# Patient Record
Sex: Male | Born: 1955 | Race: White | Hispanic: No | Marital: Married | Smoking: Former smoker
Health system: Southern US, Community
[De-identification: ages and names within clinical notes are randomized; demographics above are authoritative.]

## PROBLEM LIST (undated history)

## (undated) DIAGNOSIS — M1990 Unspecified osteoarthritis, unspecified site: Secondary | ICD-10-CM

## (undated) DIAGNOSIS — G4733 Obstructive sleep apnea (adult) (pediatric): Secondary | ICD-10-CM

## (undated) DIAGNOSIS — I10 Essential (primary) hypertension: Secondary | ICD-10-CM

## (undated) DIAGNOSIS — I2699 Other pulmonary embolism without acute cor pulmonale: Secondary | ICD-10-CM

## (undated) DIAGNOSIS — E669 Obesity, unspecified: Secondary | ICD-10-CM

## (undated) HISTORY — DX: Unspecified osteoarthritis, unspecified site: M19.90

## (undated) HISTORY — DX: Obstructive sleep apnea (adult) (pediatric): G47.33

## (undated) HISTORY — DX: Other pulmonary embolism without acute cor pulmonale: I26.99

## (undated) HISTORY — PX: TOTAL HIP ARTHROPLASTY: SHX124

## (undated) HISTORY — DX: Essential (primary) hypertension: I10

## (undated) HISTORY — DX: Obesity, unspecified: E66.9

---

## 2002-03-07 ENCOUNTER — Encounter: Payer: Self-pay | Admitting: Orthopedic Surgery

## 2002-03-10 ENCOUNTER — Inpatient Hospital Stay (HOSPITAL_COMMUNITY): Admission: RE | Admit: 2002-03-10 | Discharge: 2002-03-14 | Payer: Self-pay | Admitting: Orthopedic Surgery

## 2002-03-10 ENCOUNTER — Encounter: Payer: Self-pay | Admitting: Orthopedic Surgery

## 2003-02-15 ENCOUNTER — Encounter: Payer: Self-pay | Admitting: Orthopedic Surgery

## 2003-02-15 ENCOUNTER — Encounter: Payer: Self-pay | Admitting: Internal Medicine

## 2003-02-15 ENCOUNTER — Inpatient Hospital Stay (HOSPITAL_COMMUNITY): Admission: RE | Admit: 2003-02-15 | Discharge: 2003-02-23 | Payer: Self-pay | Admitting: Orthopedic Surgery

## 2003-02-16 ENCOUNTER — Encounter: Payer: Self-pay | Admitting: Orthopedic Surgery

## 2003-02-19 ENCOUNTER — Encounter: Payer: Self-pay | Admitting: Orthopedic Surgery

## 2003-02-20 ENCOUNTER — Encounter: Payer: Self-pay | Admitting: Orthopedic Surgery

## 2003-02-27 ENCOUNTER — Emergency Department (HOSPITAL_COMMUNITY): Admission: EM | Admit: 2003-02-27 | Discharge: 2003-02-27 | Payer: Self-pay | Admitting: Emergency Medicine

## 2004-02-13 ENCOUNTER — Inpatient Hospital Stay (HOSPITAL_COMMUNITY): Admission: RE | Admit: 2004-02-13 | Discharge: 2004-02-26 | Payer: Self-pay | Admitting: Orthopedic Surgery

## 2005-06-16 ENCOUNTER — Ambulatory Visit (HOSPITAL_COMMUNITY): Admission: RE | Admit: 2005-06-16 | Discharge: 2005-06-16 | Payer: Self-pay | Admitting: Orthopedic Surgery

## 2011-09-30 ENCOUNTER — Encounter: Payer: Self-pay | Admitting: Pulmonary Disease

## 2011-10-01 ENCOUNTER — Encounter: Payer: Self-pay | Admitting: Pulmonary Disease

## 2011-10-01 ENCOUNTER — Telehealth: Payer: Self-pay | Admitting: Pulmonary Disease

## 2011-10-01 ENCOUNTER — Ambulatory Visit (INDEPENDENT_AMBULATORY_CARE_PROVIDER_SITE_OTHER): Payer: Medicare Other | Admitting: Pulmonary Disease

## 2011-10-01 VITALS — BP 130/78 | HR 86 | Temp 98.0°F | Ht 69.0 in | Wt 277.0 lb

## 2011-10-01 DIAGNOSIS — R0600 Dyspnea, unspecified: Secondary | ICD-10-CM

## 2011-10-01 DIAGNOSIS — R0609 Other forms of dyspnea: Secondary | ICD-10-CM

## 2011-10-01 DIAGNOSIS — R0989 Other specified symptoms and signs involving the circulatory and respiratory systems: Secondary | ICD-10-CM

## 2011-10-01 NOTE — Patient Instructions (Signed)
Continue your current breathing medications Will set up for lung scan to look for small blood clots in lungs Will schedule for breathing tests, and see you back same day to review. If we do not find anything from lung standpoint to explain your worsening shortness of breath, I would recommend to your primary md to do cardiac evaluation with stress test and sound wave test of heart.  Discuss your blood pressure medication with primary md with regards to hoarseness.

## 2011-10-01 NOTE — Assessment & Plan Note (Signed)
The patient has significant worsening dyspnea on exertion over the last 6 months superimposed over his chronic dyspnea.  He apparently has a diagnosis of COPD, but those records are not available.  He is on a good bronchodilator regimen that he thinks has helped, but has had progressive symptoms despite this.  The patient is morbidly obese, and notes a 30 pound weight gain over the last 6 months.  I am sure that weight and deconditioning are playing roles here as well.  He does have some complicating factors, including his history of rheumatoid arthritis, being started on methotrexate about the same time that his breathing worsened, and a history of pulmonary embolus.  It raises the question whether he may have rheumatoid lung, possibly methotrexate pulmonary toxicity, or chronic thromboembolic disease.  I would like to repeat a chest x-ray, and also pulmonary function studies.  I think he also needs a ventilation perfusion scan to rule out chronic thromboembolic disease.  If all of this is unremarkable, I would highly recommend a cardiac workup by his primary care physician.

## 2011-10-01 NOTE — Progress Notes (Signed)
  Subjective:    Patient ID: Robert Andrade, male    DOB: 1956-04-27, 56 y.o.   MRN: [id number]  HPI The patient is a 56 year old male who I've been asked to see for significant dyspnea on exertion.  The patient states that he has had long-standing shortness of breath, but feels that it has been getting worse over the last 6-8 months.  He describes a one block dyspnea on exertion at a moderate pace on flat ground, and will get winded bringing groceries in from the car.  Records are not available, but he was apparently diagnosed with COPD in 2009.  He has been on Spiriva and symbicort and thinks the inhalers have helped him.  Complicating all of this, is his history of a pulmonary embolus in 2009 after having surgery.  He also has a history of rheumatoid arthritis, and started on methotrexate in September of last year.  He is also on low dose prednisone for this.  He does not have a chronic cough or mucus production, but has had some intermittent pleuritic chest pain.  He denies significant lower extremity edema except on occasion.  The patient states that his weight has increased 30 pounds over the last 6 months.  He also has intermittent hoarseness, but is on an ACE inhibitor.  He has not had pulmonary function studies or a chest x-ray since 2009.   Review of Systems  Constitutional: Positive for unexpected weight change. Negative for fever.  HENT: Positive for dental problem. Negative for ear pain, nosebleeds, congestion, sore throat, rhinorrhea, sneezing, trouble swallowing, postnasal drip and sinus pressure.   Eyes: Negative for redness and itching.  Respiratory: Positive for cough and shortness of breath. Negative for chest tightness and wheezing.   Cardiovascular: Positive for chest pain and leg swelling. Negative for palpitations.  Gastrointestinal: Negative for nausea and vomiting.  Genitourinary: Negative for dysuria.  Musculoskeletal: Positive for joint swelling.  Skin: Negative for rash.    Neurological: Negative for headaches.  Hematological: Does not bruise/bleed easily.  Psychiatric/Behavioral: Positive for dysphoric mood. The patient is not nervous/anxious.        Objective:   Physical Exam Constitutional:  Obese male, no acute distress  HENT:  Nares patent without discharge, turbinate hypertrophy  Oropharynx without exudate, palate and uvula are thick and elongated.  Eyes:  Perrla, eomi, no scleral icterus  Neck:  No JVD, no TMG  Cardiovascular:  Normal rate, regular rhythm, no rubs or gallops.  No murmurs        Intact distal pulses  Pulmonary :  Normal breath sounds, no stridor or respiratory distress   Faint basilar crackles, no wheezing  Abdominal:  Soft, nondistended, bowel sounds present.  No tenderness noted.   Musculoskeletal:  mild lower extremity edema noted.  Lymph Nodes:  No cervical lymphadenopathy noted  Skin:  No cyanosis noted  Neurologic:  Alert, appropriate, moves all 4 extremities without obvious deficit.         Assessment & Plan:

## 2011-10-01 NOTE — Telephone Encounter (Signed)
Megan, ;please make sure we follow thru with getting records from [facility] on this pt. Lawson Fiscal was supposed to initiate when you left for lunch. Need pfts, last ov note, any ct chest report or cxr report.

## 2011-10-03 ENCOUNTER — Telehealth: Payer: Self-pay | Admitting: Pulmonary Disease

## 2011-10-03 NOTE — Telephone Encounter (Signed)
Received copies from [facility],on 2.1.13. Forwarded 2 pages to Dr. Shelle Iron ,for review.  SJ

## 2011-10-08 ENCOUNTER — Telehealth: Payer: Self-pay | Admitting: Pulmonary Disease

## 2011-10-08 NOTE — Telephone Encounter (Signed)
Robert Andrade, please let pt know that we just received results of lung scan.  There were no blood clots seen.  Good news. Will see him back when he has pfts to discuss further.

## 2011-10-10 NOTE — Telephone Encounter (Signed)
LMOM for pt TCB 

## 2011-10-10 NOTE — Telephone Encounter (Signed)
Pt returned call. Robert Andrade  

## 2011-10-10 NOTE — Telephone Encounter (Signed)
Spoke with pt about his results per [facility]---no blood clots seen---good news---will follow up and discuss further with pt after pfts.   Pt voiced his understanding of this.

## 2011-10-16 NOTE — Telephone Encounter (Signed)
i have faxed [facility] MULTIPLE times with no response from Dr. Terrilee Croak office.  Called and LM with Nell informing her what records i need and to send asap.

## 2011-10-20 NOTE — Telephone Encounter (Signed)
FINALLY received records from Dr. Terrilee Croak office. Put in [facility] VIP folder for him to review.

## 2011-10-21 ENCOUNTER — Ambulatory Visit (INDEPENDENT_AMBULATORY_CARE_PROVIDER_SITE_OTHER): Payer: Medicare Other | Admitting: Pulmonary Disease

## 2011-10-21 ENCOUNTER — Encounter: Payer: Self-pay | Admitting: Pulmonary Disease

## 2011-10-21 VITALS — BP 120/70 | HR 96 | Temp 98.4°F | Ht 69.0 in | Wt 275.0 lb

## 2011-10-21 DIAGNOSIS — I2699 Other pulmonary embolism without acute cor pulmonale: Secondary | ICD-10-CM | POA: Insufficient documentation

## 2011-10-21 DIAGNOSIS — G4733 Obstructive sleep apnea (adult) (pediatric): Secondary | ICD-10-CM | POA: Insufficient documentation

## 2011-10-21 DIAGNOSIS — R0609 Other forms of dyspnea: Secondary | ICD-10-CM

## 2011-10-21 DIAGNOSIS — R0600 Dyspnea, unspecified: Secondary | ICD-10-CM

## 2011-10-21 LAB — PULMONARY FUNCTION TEST

## 2011-10-21 NOTE — Patient Instructions (Signed)
Your breathing tests are completely normal.  You do not have copd, but I cannot exclude the possibility of asthma.   Stop spiriva and symbicort.  If you have worsening of your breathing, then can get back on symbicort for treatment of asthma. Talk with Dr. Jeanie Sewer about coming off the lisinopril.  This can cause pulmonary symptoms and hoarseness which can mimic lung disease. Work on weight loss and conditioning I do not see a pulmonary cause of your shortness of breath, and you will need to discuss with Dr. Jeanie Sewer whether to take a look at your heart.

## 2011-10-21 NOTE — Assessment & Plan Note (Signed)
The patient's chest x-ray is unremarkable, there is no evidence for thromboembolic disease by his ventilation perfusion scan, and his PFTs today are normal.  I suspect that his dyspnea on exertion is coming from his morbid obesity and conditioning, but also cannot exclude a cardiac component.  I also think he needs to come off the ACE inhibitor because of his hoarseness and upper airway instability.  At this point, I have asked the patient to stop Spiriva and symbicort, but if he has worsening of his breathing, I would just start the symbicort back.  I have explained to him that normal breathing studies exclude COPD, but do not exclude possible asthma.

## 2011-10-21 NOTE — Progress Notes (Signed)
  Subjective:    Patient ID: Robert Andrade, male    DOB: 09-23-1955, 56 y.o.   MRN: [id number]  HPI Patient comes in today for followup of his pulmonary function studies, ordered as part of her workup for dyspnea on exertion.  He has had a chest x-ray that was unremarkable, as well as a ventilation perfusion scan showed no evidence for thromboembolic disease.  His PFTs today are surprisingly normal, with no evidence for significant COPD.  The patient continues to have significant hoarseness, however he is still on an ACE inhibitor.  I have reviewed the study with him in detail, and answered all of his questions.   Review of Systems  Constitutional: Positive for diaphoresis. Negative for fever and unexpected weight change.  HENT: Negative for ear pain, nosebleeds, congestion, sore throat, rhinorrhea, sneezing, trouble swallowing, dental problem, postnasal drip and sinus pressure.   Eyes: Negative for redness and itching.  Respiratory: Positive for chest tightness, shortness of breath and wheezing. Negative for cough.   Cardiovascular: Positive for leg swelling. Negative for palpitations.  Gastrointestinal: Negative for nausea and vomiting.  Genitourinary: Negative for dysuria.  Musculoskeletal: Positive for joint swelling.  Skin: Negative for rash.  Neurological: Negative for headaches.  Hematological: Does not bruise/bleed easily.  Psychiatric/Behavioral: Negative for dysphoric mood. The patient is not nervous/anxious.        Objective:   Physical Exam Morbidly obese male in no acute distress Nose without purulence or discharge noted Chest with mildly decreased breath sounds, no wheezing Lower extremities with mild edema, no cyanosis Alert and oriented, moves all 4 strands.       Assessment & Plan:

## 2011-10-21 NOTE — Progress Notes (Signed)
PFT done today. 

## 2014-01-02 ENCOUNTER — Institutional Professional Consult (permissible substitution): Payer: Medicare Other | Admitting: Internal Medicine

## 2014-01-04 ENCOUNTER — Encounter: Payer: Self-pay | Admitting: Pulmonary Disease

## 2014-01-04 ENCOUNTER — Ambulatory Visit (INDEPENDENT_AMBULATORY_CARE_PROVIDER_SITE_OTHER): Payer: Medicare Other | Admitting: Pulmonary Disease

## 2014-01-04 VITALS — BP 112/72 | HR 88 | Temp 98.6°F | Ht 69.0 in | Wt 288.0 lb

## 2014-01-04 DIAGNOSIS — R0989 Other specified symptoms and signs involving the circulatory and respiratory systems: Secondary | ICD-10-CM

## 2014-01-04 DIAGNOSIS — R0609 Other forms of dyspnea: Secondary | ICD-10-CM

## 2014-01-04 NOTE — Progress Notes (Signed)
   Subjective:    Patient ID: Robert Andrade, male    DOB: 08/19/1956, 58 y.o.   MRN: [id number]  HPI The patient comes in today for an acute sick visit. He has not been seen in over 2 years, and his prior evaluation for dyspnea on exertion showed no significant pulmonary issues. He was felt to possibly have asthma, but did not have COPD. I have recommended a cardiac workup, and the patient is unclear whether this was ever done or not. He comes in today with worsening dyspnea on exertion over the last 3 months, but is also gained 13 pounds since the last visit. He continues on Symbicort, and spirometry from his primary physician last month shows no significant airflow obstruction. He has had a chest x-ray which is not available for my review, but the report mentions increased interstitial markings. This was not an issue on his x-ray 2 years ago. The patient is on methotrexate and plaque 10 for arthritis, but he is unable to tell me whether this was rheumatoid or not. He denies any worsening lower extremity edema, nor classic pleuritic chest pain. He has had a history of a pulmonary embolus in the past. He also is describing chest pressure which is worsening with exertion.   Review of Systems  Constitutional: Negative for fever and unexpected weight change.  HENT: Negative for congestion, dental problem, ear pain, nosebleeds, postnasal drip, rhinorrhea, sinus pressure, sneezing, sore throat and trouble swallowing.   Eyes: Negative for redness and itching.  Respiratory: Positive for cough, chest tightness, shortness of breath and wheezing.   Cardiovascular: Positive for chest pain. Negative for palpitations and leg swelling.  Gastrointestinal: Negative for nausea and vomiting.  Genitourinary: Negative for dysuria.  Musculoskeletal: Negative for joint swelling.  Skin: Negative for rash.  Neurological: Negative for headaches.  Hematological: Does not bruise/bleed easily.  Psychiatric/Behavioral:  Negative for dysphoric mood. The patient is not nervous/anxious.        Objective:   Physical Exam Morbidly obese male in no acute distress Nose without purulence or discharge noted Neck without lymphadenopathy or thyromegaly Chest with minimal basilar crackles, excellent airflow, no wheezing Cardiac exam with regular rate and rhythm Lower extremities with no significant edema, no cyanosis Alert and oriented, moves all 4 extremities.       Assessment & Plan:

## 2014-01-04 NOTE — Patient Instructions (Signed)
Your asthma is NOT the cause of your shortness of breath.  Will schedule you for a lung scan to make sure you have not had another blood clot. Will do a ct scan of your chest to find out if the abnormalities on your chest xray are due to fluid or possible scarring from your arthritis.  Work on [employer] You need to let your primary md know that you are having chest pain, and may need a repeat cardiac evaluation. Will call you with results of your studies.

## 2014-01-04 NOTE — Assessment & Plan Note (Signed)
The patient has worsening dyspnea on exertion from his last visit, and especially over the last 3 months. His pulmonary workup in the past has been unremarkable, and his spirometry recently on medication shows no significant airflow obstruction. A recent chest x-ray however mentions on the report increased interstitial markings, and this was not an issue 2 years ago. The patient has a history of arthritis for which he is on immunosuppressive agents, but he is unsure if this is rheumatoid or not. I think we need to evaluate his abnormal x-ray with a high-resolution CT, to find out if his increased markings are do to edema or new interstitial lung disease. He also has a history of a pulmonary embolus, and I think we need to make sure this is not an issue now. Because of his elevated creatinine, I would like to avoid a CT angiogram, and we'll order a ventilation perfusion scan. I am also concerned about the potential for cardiac disease, and asked the patient to call his primary physician ASAP to let him know that he was having chest discomfort with activity. Finally, I stressed to the patient the importance of aggressive weight loss.

## 2014-01-09 ENCOUNTER — Encounter (HOSPITAL_COMMUNITY)
Admission: RE | Admit: 2014-01-09 | Discharge: 2014-01-09 | Disposition: A | Discharge status: Home / Self Care | Payer: Medicare Other | Source: Ambulatory Visit | Attending: Pulmonary Disease | Admitting: Pulmonary Disease

## 2014-01-09 ENCOUNTER — Encounter (HOSPITAL_COMMUNITY): Payer: Self-pay

## 2014-01-09 ENCOUNTER — Ambulatory Visit (HOSPITAL_COMMUNITY)
Admission: RE | Admit: 2014-01-09 | Discharge: 2014-01-09 | Disposition: A | Discharge status: Home / Self Care | Payer: Medicare Other | Source: Ambulatory Visit | Attending: Pulmonary Disease | Admitting: Pulmonary Disease

## 2014-01-09 DIAGNOSIS — I2510 Atherosclerotic heart disease of native coronary artery without angina pectoris: Secondary | ICD-10-CM | POA: Insufficient documentation

## 2014-01-09 DIAGNOSIS — I700 Atherosclerosis of aorta: Secondary | ICD-10-CM | POA: Insufficient documentation

## 2014-01-09 DIAGNOSIS — R0609 Other forms of dyspnea: Principal | ICD-10-CM

## 2014-01-09 DIAGNOSIS — J438 Other emphysema: Secondary | ICD-10-CM | POA: Insufficient documentation

## 2014-01-09 DIAGNOSIS — R0989 Other specified symptoms and signs involving the circulatory and respiratory systems: Principal | ICD-10-CM | POA: Insufficient documentation

## 2014-01-09 DIAGNOSIS — R0602 Shortness of breath: Secondary | ICD-10-CM | POA: Insufficient documentation

## 2014-01-09 MED ORDER — TECHNETIUM TO 99M ALBUMIN AGGREGATED
5.5000 | Freq: Once | INTRAVENOUS | Status: AC | PRN
  Administered 2014-01-09: 6 via INTRAVENOUS

## 2014-01-09 MED ORDER — TECHNETIUM TC 99M DIETHYLENETRIAME-PENTAACETIC ACID: 40.0000 | Freq: Once | INTRAVENOUS | Status: AC | PRN

## 2014-09-13 IMAGING — CR DG CHEST 2V
2 series · 2 of 2 positions shown · non-contrast
Comparison: 05/01/2013

CLINICAL DATA: Shortness of breath.

EXAM:
CHEST  2 VIEW

[w chest pa]
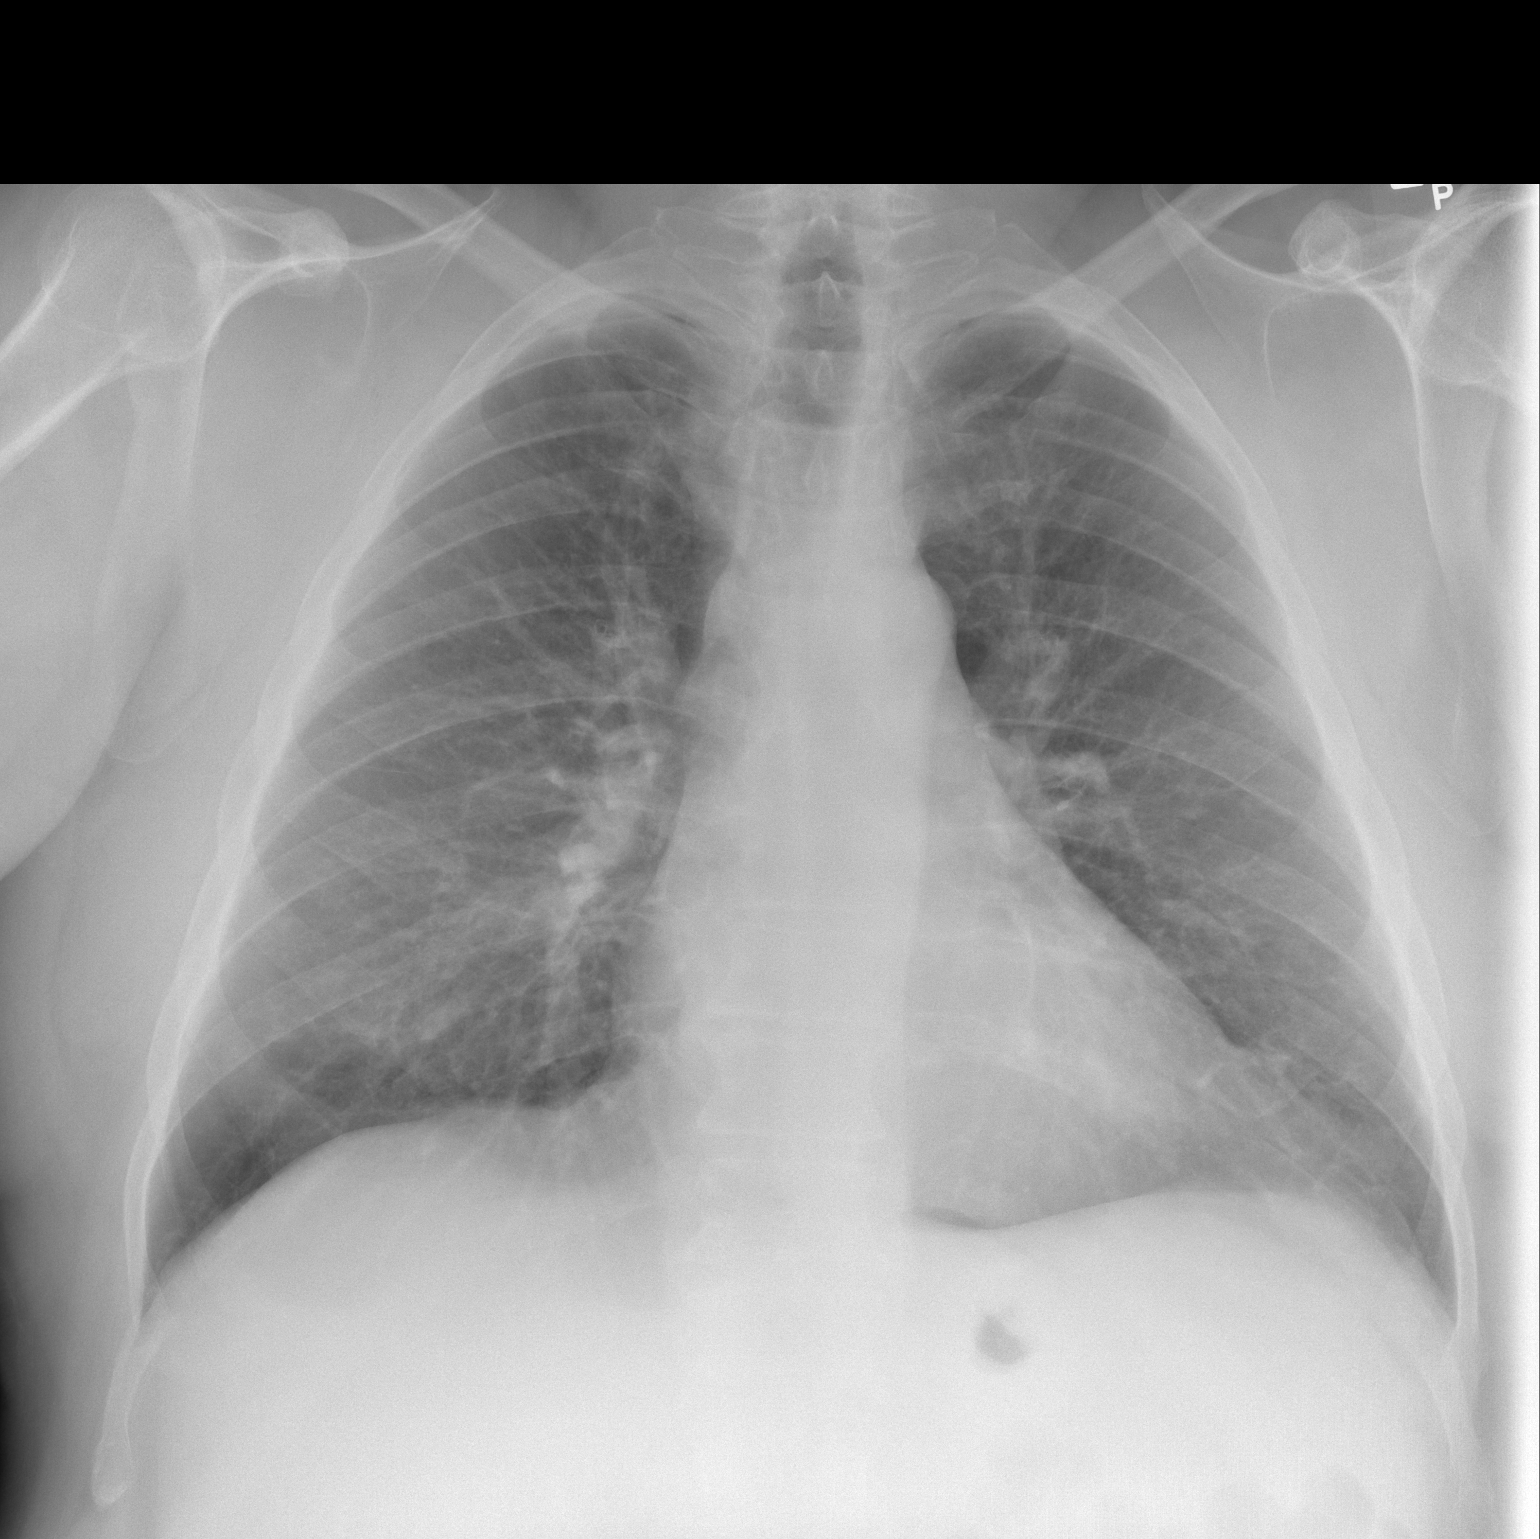

[w chest lat]
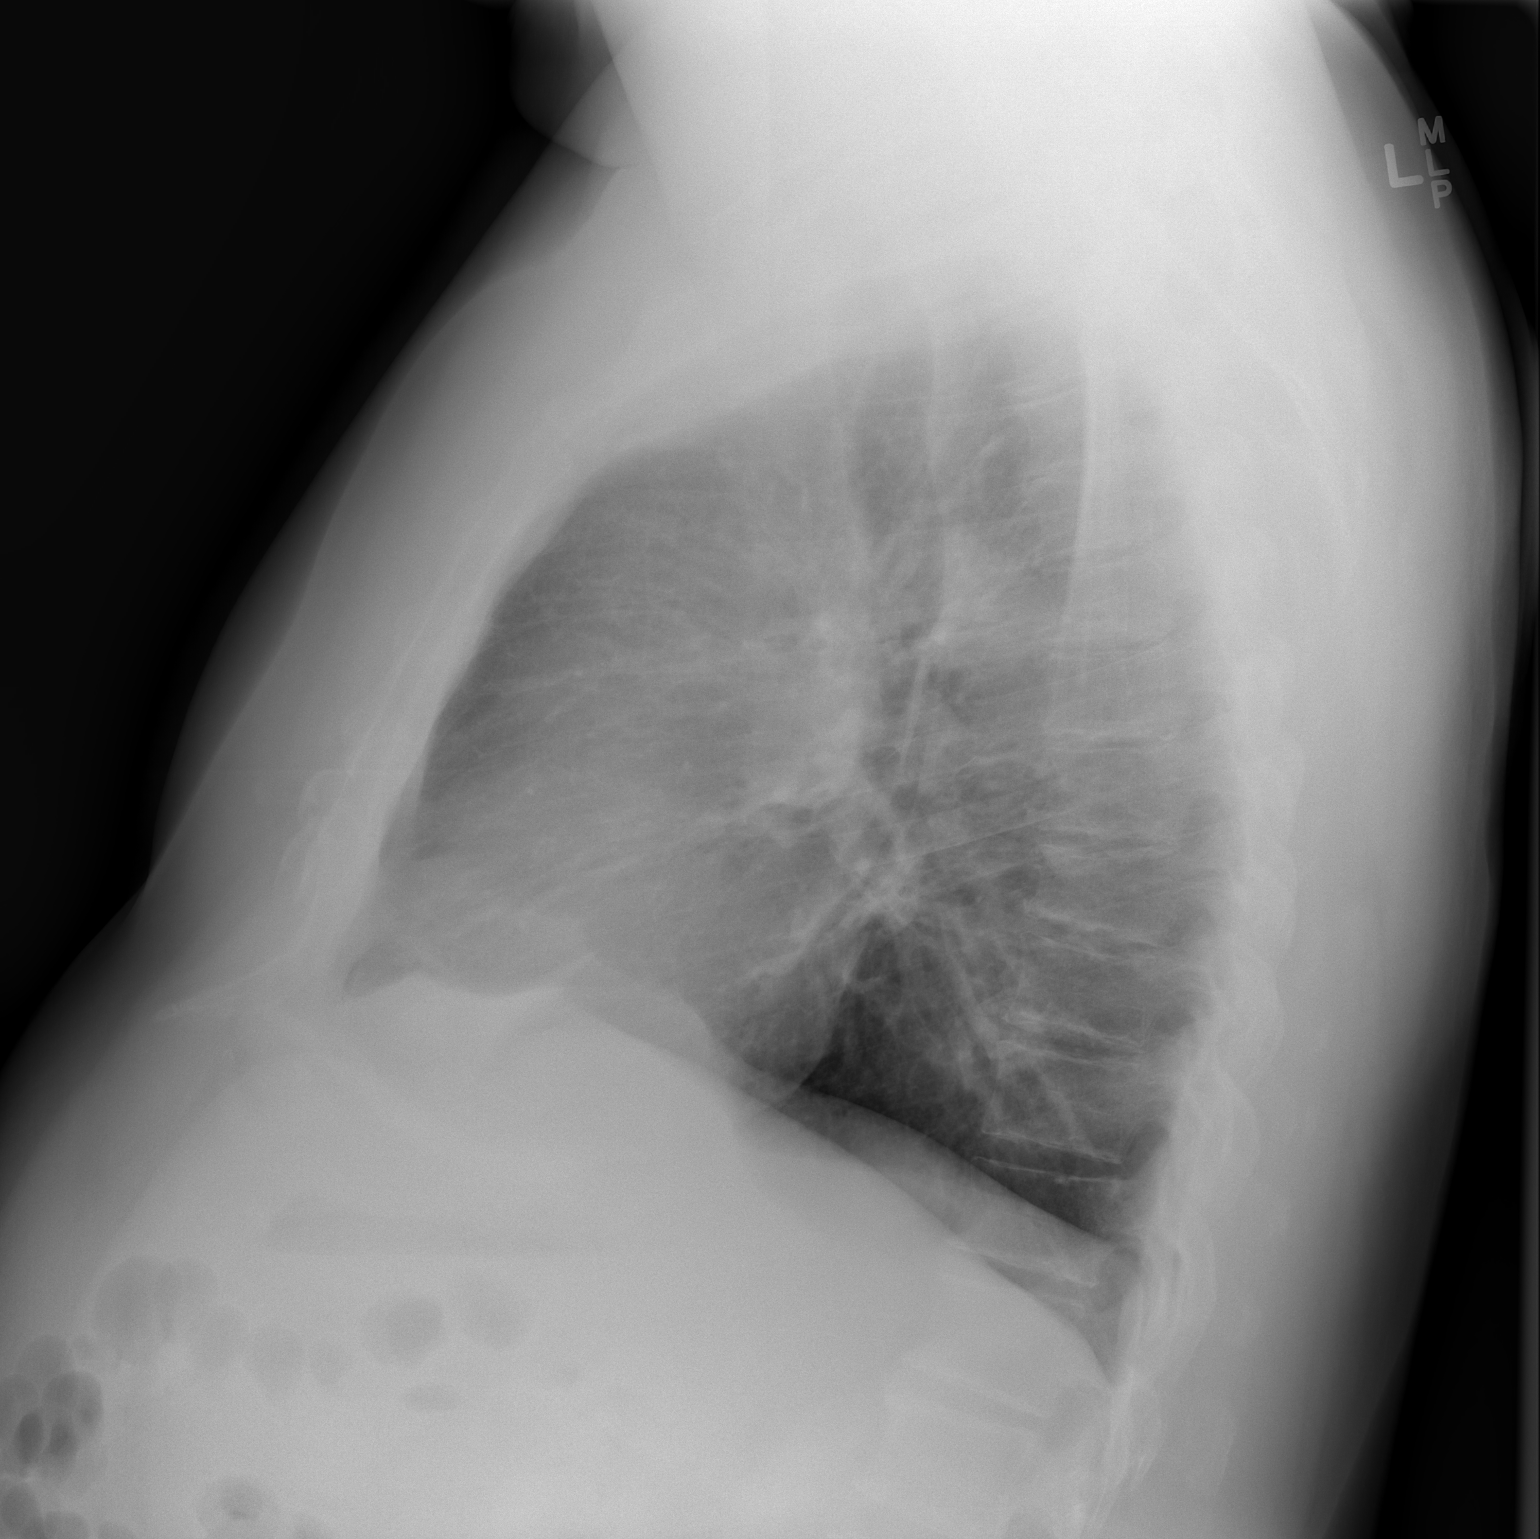

[2 of 2 positions shown; findings below may reference images not displayed]

FINDINGS: Heart and mediastinal contours are within normal limits. No focal
opacities or effusions. No acute bony abnormality.
IMPRESSION: No active cardiopulmonary disease.

## 2018-05-02 DEATH — deceased
